# Patient Record
Sex: Female | Born: 1985 | Race: White | Hispanic: No | Smoking: Current every day smoker
Health system: Southern US, Community
[De-identification: ages and names within clinical notes are randomized; demographics above are authoritative.]

## PROBLEM LIST (undated history)

## (undated) DIAGNOSIS — Z789 Other specified health status: Secondary | ICD-10-CM

## (undated) DIAGNOSIS — J45909 Unspecified asthma, uncomplicated: Secondary | ICD-10-CM

## (undated) DIAGNOSIS — R51 Headache: Secondary | ICD-10-CM

## (undated) DIAGNOSIS — R519 Headache, unspecified: Secondary | ICD-10-CM

## (undated) DIAGNOSIS — E039 Hypothyroidism, unspecified: Secondary | ICD-10-CM

## (undated) HISTORY — PX: TUBAL LIGATION: SHX77

---

## 2015-12-31 ENCOUNTER — Other Ambulatory Visit: Payer: Self-pay | Admitting: Nurse Practitioner

## 2015-12-31 DIAGNOSIS — R112 Nausea with vomiting, unspecified: Secondary | ICD-10-CM

## 2016-01-02 ENCOUNTER — Ambulatory Visit
Admission: RE | Admit: 2016-01-02 | Discharge: 2016-01-02 | Disposition: A | Discharge status: Home / Self Care | Payer: Medicaid Other | Source: Ambulatory Visit | Attending: Nurse Practitioner | Admitting: Nurse Practitioner

## 2016-01-02 DIAGNOSIS — R112 Nausea with vomiting, unspecified: Secondary | ICD-10-CM | POA: Diagnosis present

## 2016-01-16 ENCOUNTER — Encounter: Payer: Self-pay | Admitting: *Deleted

## 2016-01-17 ENCOUNTER — Ambulatory Visit: Payer: Medicaid Other | Admitting: Anesthesiology

## 2016-01-17 ENCOUNTER — Encounter
Admission: RE | Disposition: A | Discharge status: Home / Self Care | Payer: Self-pay | Source: Ambulatory Visit | Attending: Unknown Physician Specialty

## 2016-01-17 ENCOUNTER — Encounter: Payer: Self-pay | Admitting: *Deleted

## 2016-01-17 ENCOUNTER — Ambulatory Visit
Admission: RE | Admit: 2016-01-17 | Discharge: 2016-01-17 | Disposition: A | Discharge status: Home / Self Care | Payer: Medicaid Other | Source: Ambulatory Visit | Attending: Unknown Physician Specialty | Admitting: Unknown Physician Specialty

## 2016-01-17 DIAGNOSIS — Z886 Allergy status to analgesic agent status: Secondary | ICD-10-CM | POA: Insufficient documentation

## 2016-01-17 DIAGNOSIS — R531 Weakness: Secondary | ICD-10-CM | POA: Insufficient documentation

## 2016-01-17 DIAGNOSIS — K3189 Other diseases of stomach and duodenum: Secondary | ICD-10-CM | POA: Insufficient documentation

## 2016-01-17 DIAGNOSIS — F17210 Nicotine dependence, cigarettes, uncomplicated: Secondary | ICD-10-CM | POA: Diagnosis not present

## 2016-01-17 DIAGNOSIS — F329 Major depressive disorder, single episode, unspecified: Secondary | ICD-10-CM | POA: Insufficient documentation

## 2016-01-17 DIAGNOSIS — F419 Anxiety disorder, unspecified: Secondary | ICD-10-CM | POA: Insufficient documentation

## 2016-01-17 DIAGNOSIS — Z6824 Body mass index (BMI) 24.0-24.9, adult: Secondary | ICD-10-CM | POA: Diagnosis not present

## 2016-01-17 DIAGNOSIS — M4690 Unspecified inflammatory spondylopathy, site unspecified: Secondary | ICD-10-CM | POA: Diagnosis not present

## 2016-01-17 DIAGNOSIS — Z91018 Allergy to other foods: Secondary | ICD-10-CM | POA: Insufficient documentation

## 2016-01-17 DIAGNOSIS — Z79899 Other long term (current) drug therapy: Secondary | ICD-10-CM | POA: Insufficient documentation

## 2016-01-17 DIAGNOSIS — Z8711 Personal history of peptic ulcer disease: Secondary | ICD-10-CM | POA: Insufficient documentation

## 2016-01-17 DIAGNOSIS — K2950 Unspecified chronic gastritis without bleeding: Secondary | ICD-10-CM | POA: Insufficient documentation

## 2016-01-17 DIAGNOSIS — J45909 Unspecified asthma, uncomplicated: Secondary | ICD-10-CM | POA: Diagnosis not present

## 2016-01-17 DIAGNOSIS — R5383 Other fatigue: Secondary | ICD-10-CM | POA: Diagnosis not present

## 2016-01-17 DIAGNOSIS — R197 Diarrhea, unspecified: Secondary | ICD-10-CM | POA: Insufficient documentation

## 2016-01-17 DIAGNOSIS — F1290 Cannabis use, unspecified, uncomplicated: Secondary | ICD-10-CM | POA: Insufficient documentation

## 2016-01-17 DIAGNOSIS — G43909 Migraine, unspecified, not intractable, without status migrainosus: Secondary | ICD-10-CM | POA: Diagnosis not present

## 2016-01-17 DIAGNOSIS — E039 Hypothyroidism, unspecified: Secondary | ICD-10-CM | POA: Insufficient documentation

## 2016-01-17 DIAGNOSIS — R112 Nausea with vomiting, unspecified: Secondary | ICD-10-CM | POA: Diagnosis present

## 2016-01-17 DIAGNOSIS — Z91419 Personal history of unspecified adult abuse: Secondary | ICD-10-CM | POA: Insufficient documentation

## 2016-01-17 DIAGNOSIS — R634 Abnormal weight loss: Secondary | ICD-10-CM | POA: Insufficient documentation

## 2016-01-17 DIAGNOSIS — F5081 Binge eating disorder: Secondary | ICD-10-CM | POA: Diagnosis not present

## 2016-01-17 HISTORY — DX: Headache, unspecified: R51.9

## 2016-01-17 HISTORY — DX: Other specified health status: Z78.9

## 2016-01-17 HISTORY — DX: Hypothyroidism, unspecified: E03.9

## 2016-01-17 HISTORY — DX: Unspecified asthma, uncomplicated: J45.909

## 2016-01-17 HISTORY — PX: ESOPHAGOGASTRODUODENOSCOPY (EGD) WITH PROPOFOL: SHX5813

## 2016-01-17 HISTORY — DX: Headache: R51

## 2016-01-17 LAB — URINE DRUG SCREEN, QUALITATIVE (ARMC ONLY)
AMPHETAMINES, UR SCREEN: NOT DETECTED
BENZODIAZEPINE, UR SCRN: NOT DETECTED
Barbiturates, Ur Screen: NOT DETECTED
Cannabinoid 50 Ng, Ur ~~LOC~~: POSITIVE — AB
Cocaine Metabolite,Ur ~~LOC~~: NOT DETECTED
MDMA (Ecstasy)Ur Screen: NOT DETECTED
METHADONE SCREEN, URINE: NOT DETECTED
Opiate, Ur Screen: NOT DETECTED
PHENCYCLIDINE (PCP) UR S: NOT DETECTED
TRICYCLIC, UR SCREEN: NOT DETECTED

## 2016-01-17 LAB — POCT PREGNANCY, URINE: PREG TEST UR: NEGATIVE

## 2016-01-17 SURGERY — ESOPHAGOGASTRODUODENOSCOPY (EGD) WITH PROPOFOL
Anesthesia: General

## 2016-01-17 MED ORDER — MIDAZOLAM HCL 2 MG/2ML IJ SOLN
INTRAMUSCULAR | Status: DC | PRN
  Administered 2016-01-17: 1 mg via INTRAVENOUS

## 2016-01-17 MED ORDER — SODIUM CHLORIDE 0.9 % IV SOLN
INTRAVENOUS | Status: DC
  Administered 2016-01-17: 1000 mL via INTRAVENOUS

## 2016-01-17 MED ORDER — LIDOCAINE HCL (CARDIAC) 20 MG/ML IV SOLN
INTRAVENOUS | Status: DC | PRN
  Administered 2016-01-17: 60 mg via INTRAVENOUS

## 2016-01-17 MED ORDER — PROPOFOL 500 MG/50ML IV EMUL
INTRAVENOUS | Status: DC | PRN
  Administered 2016-01-17: 160 ug/kg/min via INTRAVENOUS

## 2016-01-17 MED ORDER — PROPOFOL 10 MG/ML IV BOLUS
INTRAVENOUS | Status: DC | PRN
  Administered 2016-01-17: 50 mg via INTRAVENOUS

## 2016-01-17 MED ORDER — FENTANYL CITRATE (PF) 100 MCG/2ML IJ SOLN
INTRAMUSCULAR | Status: DC | PRN
  Administered 2016-01-17: 50 ug via INTRAVENOUS

## 2016-01-17 NOTE — Anesthesia Postprocedure Evaluation (Signed)
Anesthesia Post Note  Patient: Fuller PlanCharity Bookbinder  Procedure(s) Performed: Procedure(s) (LRB): ESOPHAGOGASTRODUODENOSCOPY (EGD) WITH PROPOFOL (N/A)  Patient location during evaluation: PACU Anesthesia Type: General Level of consciousness: awake and patient cooperative Pain management: pain level controlled Vital Signs Assessment: post-procedure vital signs reviewed and stable Respiratory status: spontaneous breathing Cardiovascular status: blood pressure returned to baseline Anesthetic complications: no    Last Vitals:  Filed Vitals:   01/17/16 1555 01/17/16 1605  BP: 94/51 102/63  Pulse: 69 59  Temp: 36 C   Resp: 16 19    Last Pain: There were no vitals filed for this visit.               VAN STAVEREN,Lamarion Mcevers

## 2016-01-17 NOTE — Transfer of Care (Signed)
Immediate Anesthesia Transfer of Care Note  Patient: Vanessa Santiago PlanCharity Kuan  Procedure(s) Performed: Procedure(s): ESOPHAGOGASTRODUODENOSCOPY (EGD) WITH PROPOFOL (N/A)  Patient Location: PACU  Anesthesia Type:General  Level of Consciousness: sedated  Airway & Oxygen Therapy: Patient Spontanous Breathing and Patient connected to nasal cannula oxygen  Post-op Assessment: Report given to RN  Post vital signs: Reviewed and stable  Last Vitals:  Filed Vitals:   01/17/16 1411 01/17/16 1555  BP: 98/54 94/51  Pulse: 91 69  Temp: 37.1 C 36 C  Resp: 18 16    Last Pain: There were no vitals filed for this visit.       Complications: No apparent anesthesia complications

## 2016-01-17 NOTE — Op Note (Signed)
[facility] Patient Name: Vanessa Santiago Procedure Date: 01/17/2016 3:37 PM MRN: [id number] Account #: [id number] Date of Birth: 01-28-1986 Admit Type: Outpatient Age: 30 Room: [facility] ENDO ROOM 1 Gender: Female Note Status: Finalized Procedure:            Upper GI endoscopy Indications:          Persistent vomiting, Persistent vomiting of unknown                        cause Providers:            Scot Jun, MD Referring MD:         Dr Leonette Most drew clinic, MD (Referring MD) Medicines:            Propofol per Anesthesia Complications:        No immediate complications. Procedure:            Pre-Anesthesia Assessment:                       - After reviewing the risks and benefits, the patient                        was deemed in satisfactory condition to undergo the                        procedure.                       After obtaining informed consent, the endoscope was                        passed under direct vision. Throughout the procedure,                        the patient's blood pressure, pulse, and oxygen                        saturations were monitored continuously. The Endoscope                        was introduced through the mouth, and advanced to the                        second part of duodenum. The upper GI endoscopy was                        accomplished without difficulty. The patient tolerated                        the procedure well. Findings:      The examined esophagus was normal. GEJ at 38cm.      There was a moderate amount of fluid-about 200-250cc in the stomach       which was suctioned.      Patchy mild inflammation characterized by erythema and granularity was       found in the gastric antrum and in the prepyloric region of the stomach.       Biopsies were taken with a cold forceps for histology. Biopsies were       taken with a cold forceps for Helicobacter pylori testing.      Localized mucosal  flattening was found in the second portion of the       duodenum. Biopsies for histology were taken with a cold forceps for       evaluation of possible celiac disease. Impression:           - Normal esophagus.                       - Gastritis. Biopsied.                       - Flattened mucosa was found in the duodenum,                        suspicious for celiac disease. Biopsied. Recommendation:       - Await pathology results. Scot Junobert T Yonna Alwin, MD 01/17/2016 3:54:26 PM This report has been signed electronically. Number of Addenda: 0 Note Initiated On: 01/17/2016 3:37 PM      [facility]

## 2016-01-17 NOTE — Anesthesia Preprocedure Evaluation (Signed)
Anesthesia Evaluation  Patient identified by MRN, date of birth, ID band Patient awake    Reviewed: Allergy & Precautions, H&P , NPO status , Patient's Chart, lab work & pertinent test results, reviewed documented beta blocker date and time   Airway Mallampati: II   Neck ROM: full    Dental  (+) Teeth Intact   Pulmonary neg pulmonary ROS, neg shortness of breath, asthma , Current Smoker,    Pulmonary exam normal        Cardiovascular negative cardio ROS Normal cardiovascular exam Rhythm:regular Rate:Normal     Neuro/Psych  Headaches, negative neurological ROS  negative psych ROS   GI/Hepatic negative GI ROS, Neg liver ROS,   Endo/Other  negative endocrine ROSHypothyroidism   Renal/GU negative Renal ROS  negative genitourinary   Musculoskeletal   Abdominal   Peds  Hematology negative hematology ROS (+)   Anesthesia Other Findings Past Medical History:   Medical history non-contributory                             Asthma                                                       Hypothyroidism                                               Headache                                                   Past Surgical History:   CESAREAN SECTION                                              TUBAL LIGATION                                              BMI    Body Mass Index   24.80 kg/m 2     Reproductive/Obstetrics                             Anesthesia Physical Anesthesia Plan  ASA: II  Anesthesia Plan: General   Post-op Pain Management:    Induction:   Airway Management Planned:   Additional Equipment:   Intra-op Plan:   Post-operative Plan:   Informed Consent: I have reviewed the patients History and Physical, chart, labs and discussed the procedure including the risks, benefits and alternatives for the proposed anesthesia with the patient or authorized representative who has  indicated his/her understanding and acceptance.   Dental Advisory Given  Plan Discussed with: CRNA  Anesthesia Plan Comments:         Anesthesia Quick Evaluation

## 2016-01-21 LAB — SURGICAL PATHOLOGY

## 2016-01-22 ENCOUNTER — Encounter: Payer: Self-pay | Admitting: Unknown Physician Specialty

## 2017-03-12 IMAGING — US US ABDOMEN COMPLETE
1 series · 14 of 25 positions shown · non-contrast
Comparison: None.

CLINICAL DATA: Three weeks of nausea and vomiting.

EXAM:
ABDOMEN ULTRASOUND COMPLETE

[Series 1: us abdomen complete · 0.13mm/px · 14 of 141 slices shown]
[im 1/141]
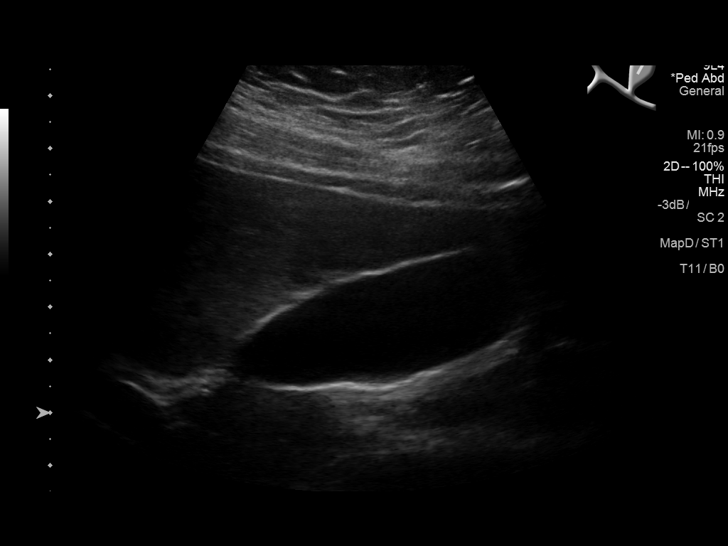
[im 12/141]
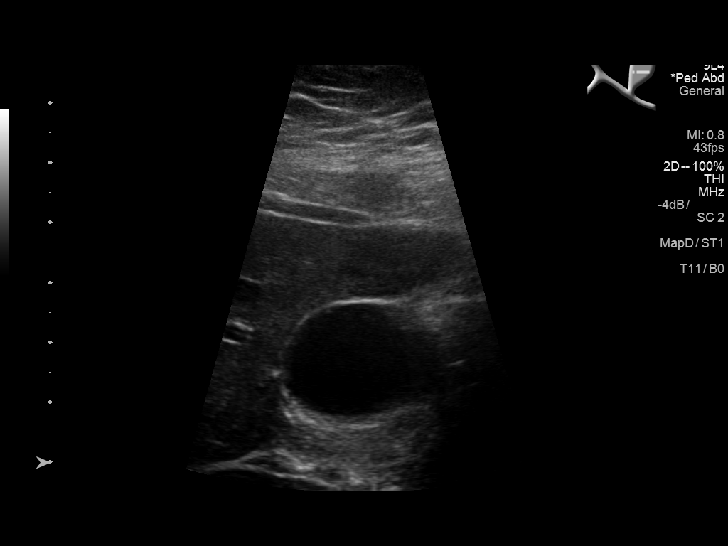
[im 24/141]
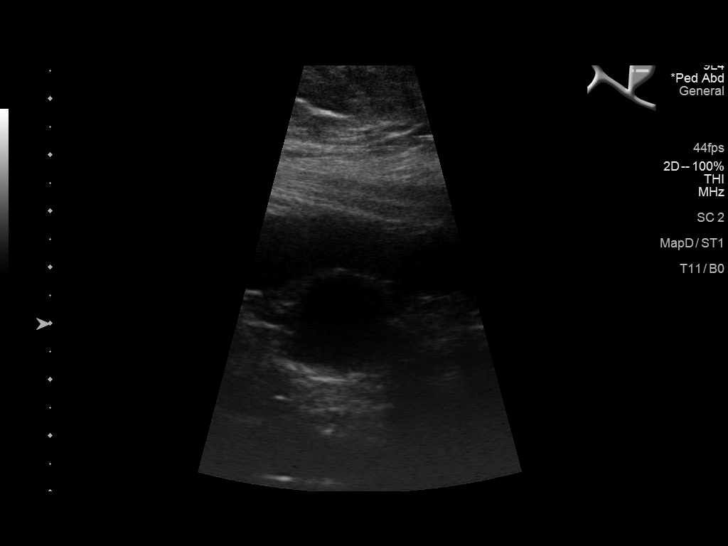
[im 36/141]
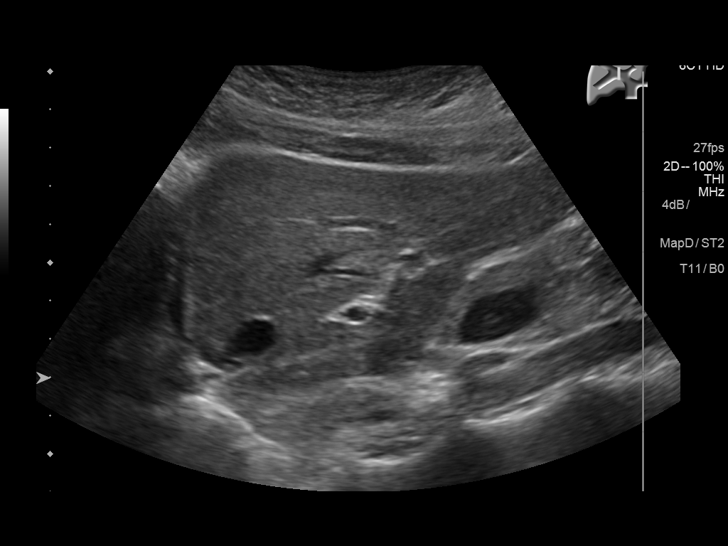
[im 47/141]
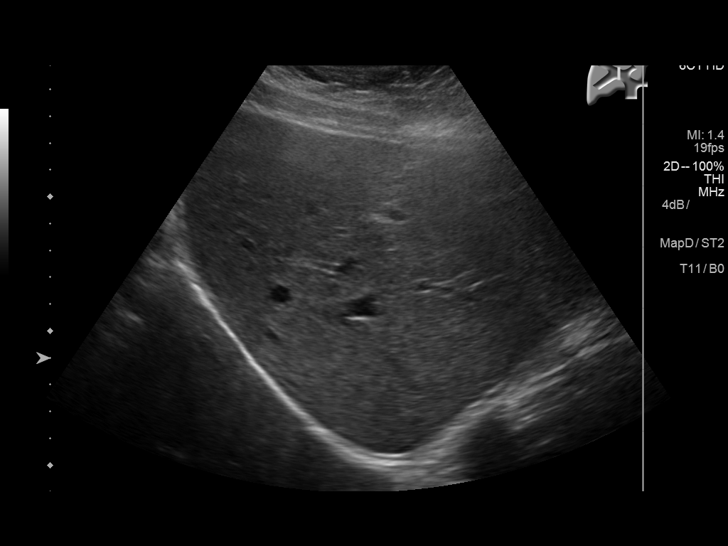
[im 53/141]
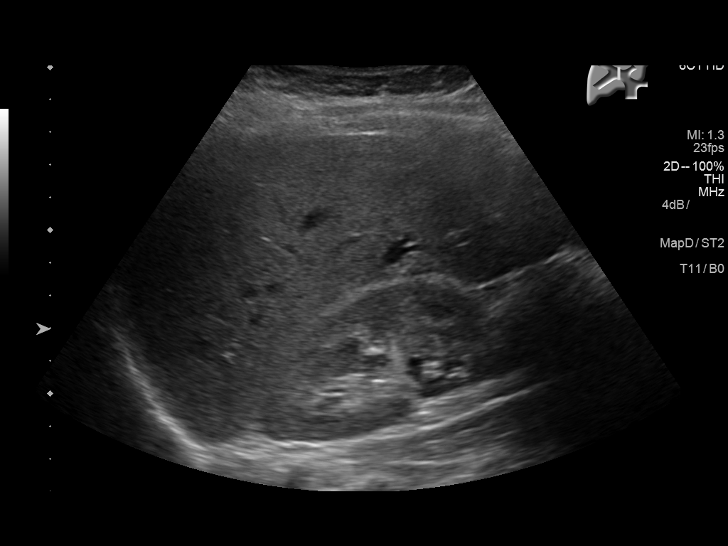
[im 65/141]
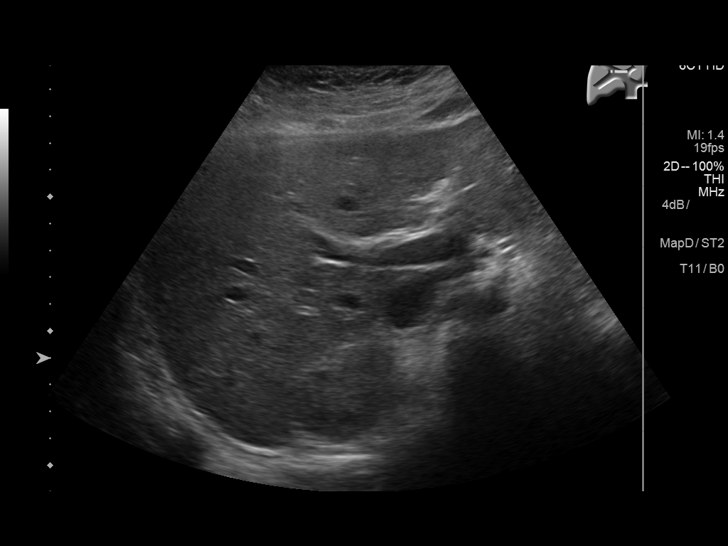
[im 76/141]
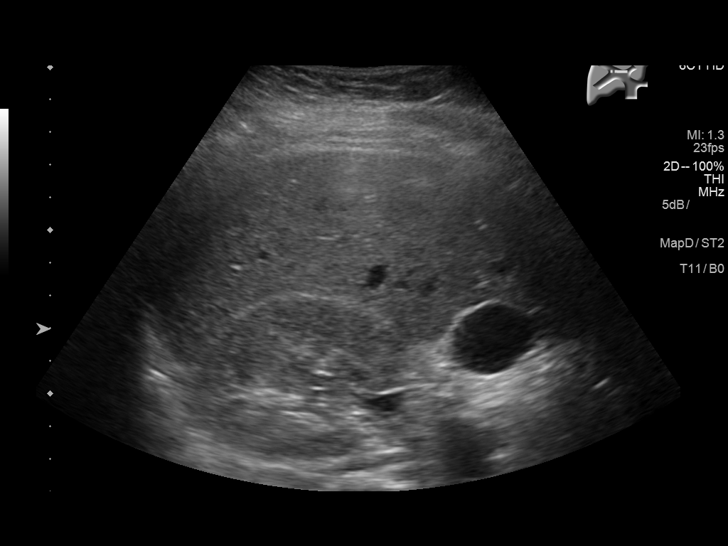
[im 88/141]
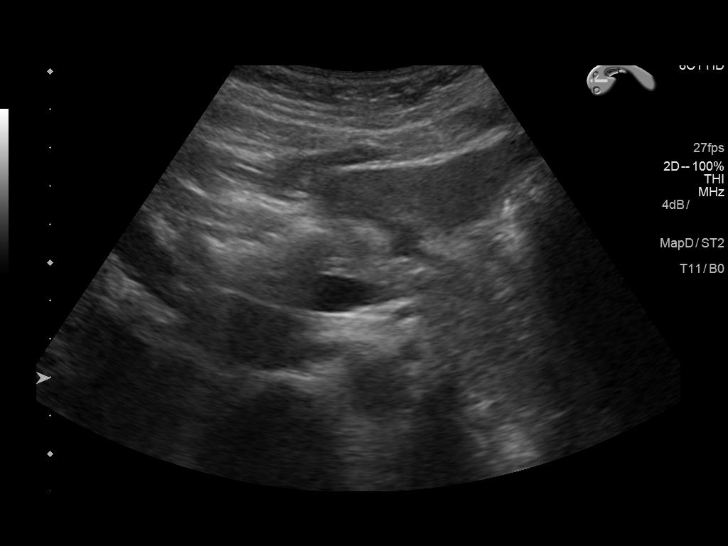
[im 94/141]
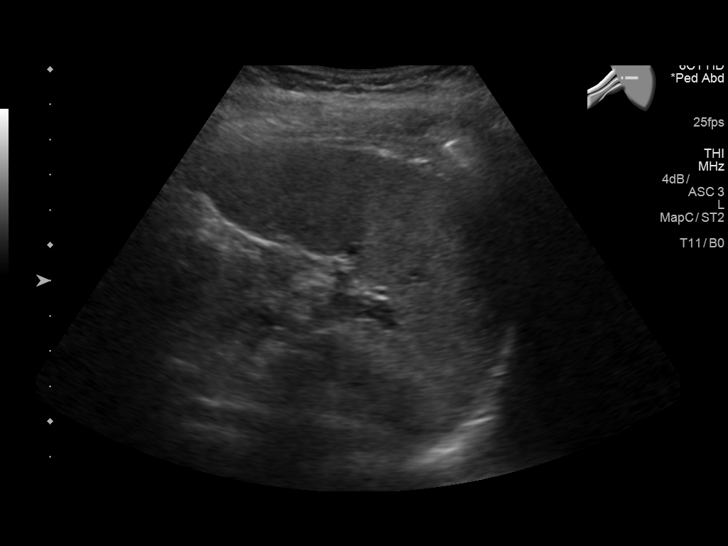
[im 106/141]
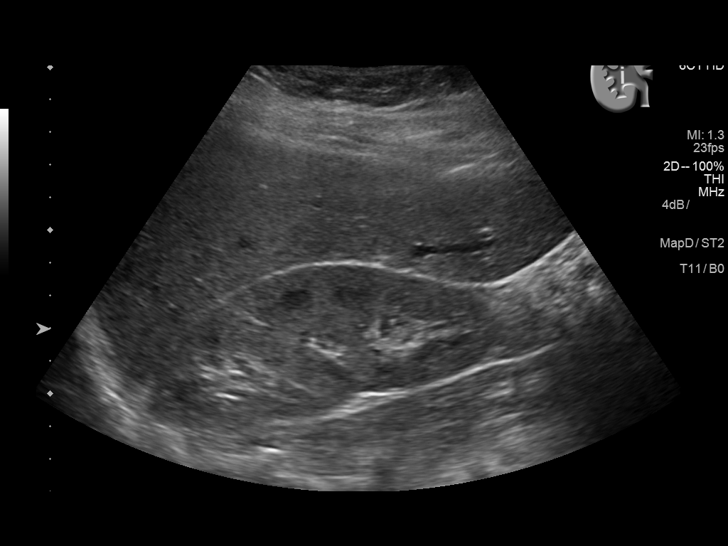
[im 117/141]
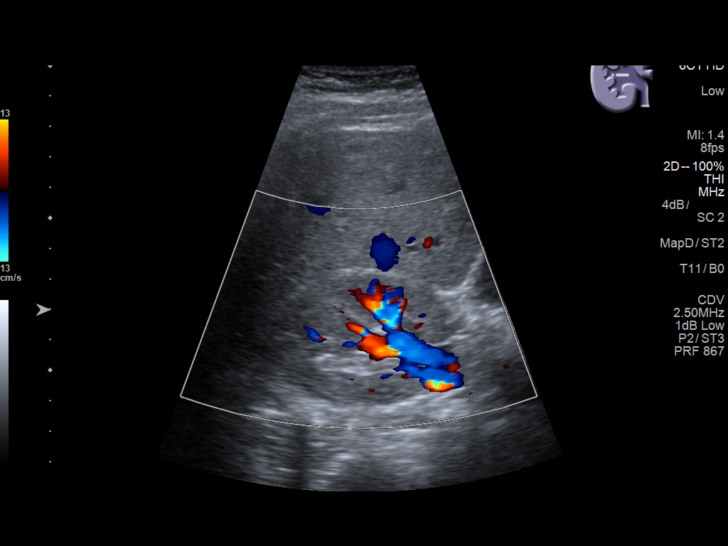
[im 129/141]
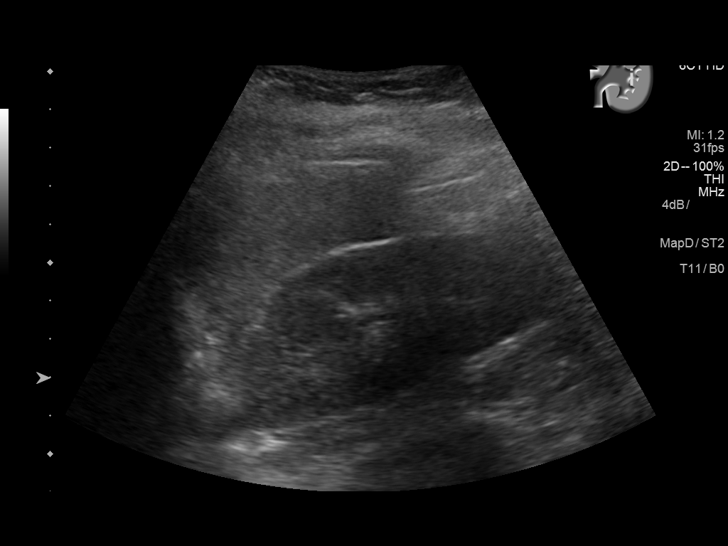
[im 141/141]
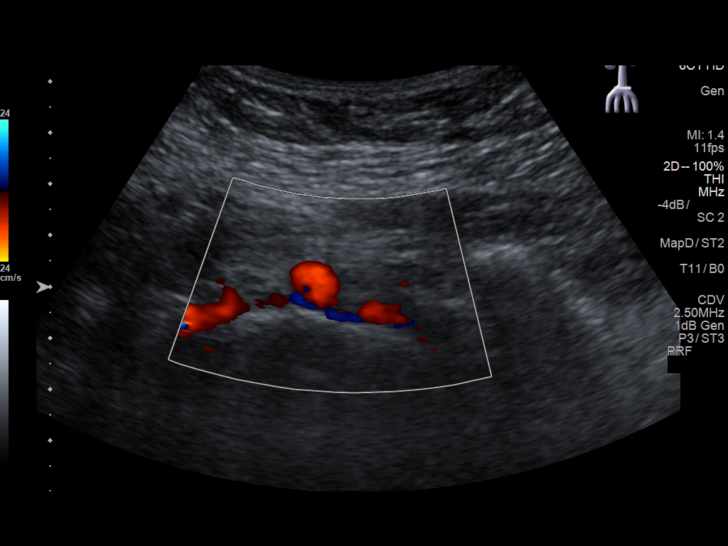

[14 of 25 positions shown; findings below may reference images not displayed]

FINDINGS: Gallbladder: No gallstones or wall thickening visualized. No
sonographic Murphy sign noted by sonographer.

Common bile duct: Diameter: 4 mm

Liver: No focal lesion identified. Within normal limits in
parenchymal echogenicity.

IVC: No abnormality visualized.

Pancreas: Visualized portion unremarkable.

Spleen: Size and appearance within normal limits.

Right Kidney: Length: 10.9 cm. Echogenicity within normal limits. No
mass or hydronephrosis visualized.

Left Kidney: Length: 9.5 cm. Echogenicity within normal limits. No
mass or hydronephrosis visualized.

Abdominal aorta: No aneurysm visualized.

Other findings: None.
IMPRESSION: Normal abdominal sonogram.  No cholelithiasis.
